# Patient Record
Sex: Female | Born: 1945 | Race: White | Hispanic: No | Smoking: Never smoker
Health system: Southern US, Community
[De-identification: ages and names within clinical notes are randomized; demographics above are authoritative.]

## PROBLEM LIST (undated history)

## (undated) DIAGNOSIS — I10 Essential (primary) hypertension: Secondary | ICD-10-CM

## (undated) DIAGNOSIS — E7800 Pure hypercholesterolemia, unspecified: Secondary | ICD-10-CM

## (undated) DIAGNOSIS — J302 Other seasonal allergic rhinitis: Secondary | ICD-10-CM

## (undated) HISTORY — PX: LAPAROSCOPIC VAGINAL HYSTERECTOMY: SUR798

## (undated) HISTORY — PX: OTHER SURGICAL HISTORY: SHX169

## (undated) HISTORY — DX: Essential (primary) hypertension: I10

## (undated) HISTORY — PX: BREAST ENHANCEMENT SURGERY: SHX7

## (undated) HISTORY — PX: BREAST LUMPECTOMY: SHX2

## (undated) HISTORY — PX: BREAST BIOPSY: SHX20

## (undated) HISTORY — PX: HEMORRHOID SURGERY: SHX153

## (undated) HISTORY — PX: VARICOSE VEIN SURGERY: SHX832

## (undated) HISTORY — DX: Pure hypercholesterolemia, unspecified: E78.00

## (undated) HISTORY — PX: HERNIA REPAIR: SHX51

## (undated) HISTORY — DX: Other seasonal allergic rhinitis: J30.2

## (undated) HISTORY — PX: HYSTERECTOMY: SHX81

---

## 1989-03-11 ENCOUNTER — Emergency Department: Admit: 1989-03-11 | Payer: Self-pay | Source: Ambulatory Visit

## 1989-09-04 ENCOUNTER — Ambulatory Visit: Admit: 1989-09-04 | Disposition: A | Discharge status: Home / Self Care | Payer: Self-pay

## 1991-11-22 ENCOUNTER — Ambulatory Visit: Admit: 1991-11-22 | Disposition: A | Discharge status: Home / Self Care | Payer: Self-pay | Source: Ambulatory Visit

## 1992-02-22 ENCOUNTER — Ambulatory Visit: Admit: 1992-02-22 | Disposition: A | Discharge status: Home / Self Care | Payer: Self-pay | Source: Ambulatory Visit

## 1993-02-26 ENCOUNTER — Emergency Department: Admit: 1993-02-26 | Disposition: A | Discharge status: Home / Self Care | Payer: Self-pay | Source: Ambulatory Visit

## 1993-11-06 ENCOUNTER — Ambulatory Visit: Admit: 1993-11-06 | Disposition: A | Discharge status: Home / Self Care | Payer: Self-pay | Source: Ambulatory Visit

## 1994-02-12 ENCOUNTER — Inpatient Hospital Stay
Admission: EM | Admit: 1994-02-12 | Disposition: A | Discharge status: Home / Self Care | Payer: Self-pay | Source: Ambulatory Visit

## 1994-10-18 ENCOUNTER — Ambulatory Visit: Admit: 1994-10-18 | Disposition: A | Discharge status: Home / Self Care | Payer: Self-pay | Source: Ambulatory Visit

## 1995-10-17 ENCOUNTER — Ambulatory Visit: Admit: 1995-10-17 | Disposition: A | Discharge status: Home / Self Care | Payer: Self-pay | Source: Ambulatory Visit

## 1996-10-18 ENCOUNTER — Ambulatory Visit: Admit: 1996-10-18 | Disposition: A | Discharge status: Home / Self Care | Payer: Self-pay | Source: Ambulatory Visit

## 1997-10-17 ENCOUNTER — Ambulatory Visit: Admit: 1997-10-17 | Disposition: A | Discharge status: Home / Self Care | Payer: Self-pay | Source: Ambulatory Visit

## 1998-03-10 ENCOUNTER — Emergency Department: Admit: 1998-03-10 | Disposition: A | Discharge status: Home / Self Care | Payer: Self-pay | Source: Ambulatory Visit

## 1998-08-29 ENCOUNTER — Ambulatory Visit: Admit: 1998-08-29 | Disposition: A | Discharge status: Home / Self Care | Payer: Self-pay | Source: Ambulatory Visit

## 1999-10-15 ENCOUNTER — Ambulatory Visit: Admit: 1999-10-15 | Disposition: A | Discharge status: Home / Self Care | Payer: Self-pay | Source: Ambulatory Visit

## 2000-12-29 ENCOUNTER — Ambulatory Visit: Admit: 2000-12-29 | Disposition: A | Discharge status: Home / Self Care | Payer: Self-pay | Source: Ambulatory Visit

## 2001-12-30 ENCOUNTER — Ambulatory Visit
Admission: RE | Admit: 2001-12-30 | Disposition: A | Discharge status: Home / Self Care | Payer: Self-pay | Source: Ambulatory Visit

## 2003-03-04 ENCOUNTER — Ambulatory Visit
Admission: RE | Admit: 2003-03-04 | Disposition: A | Discharge status: Home / Self Care | Payer: Self-pay | Source: Ambulatory Visit

## 2004-04-02 ENCOUNTER — Ambulatory Visit
Admission: RE | Admit: 2004-04-02 | Disposition: A | Discharge status: Home / Self Care | Payer: Self-pay | Source: Ambulatory Visit

## 2006-07-29 ENCOUNTER — Ambulatory Visit
Admission: RE | Admit: 2006-07-29 | Disposition: A | Discharge status: Home / Self Care | Payer: Self-pay | Source: Ambulatory Visit

## 2006-08-05 ENCOUNTER — Ambulatory Visit
Admission: RE | Admit: 2006-08-05 | Disposition: A | Discharge status: Home / Self Care | Payer: Self-pay | Source: Ambulatory Visit

## 2006-08-14 ENCOUNTER — Ambulatory Visit
Admission: RE | Admit: 2006-08-14 | Disposition: A | Discharge status: Home / Self Care | Payer: Self-pay | Source: Ambulatory Visit

## 2007-01-02 ENCOUNTER — Ambulatory Visit
Admission: RE | Admit: 2007-01-02 | Disposition: A | Discharge status: Home / Self Care | Payer: Self-pay | Source: Ambulatory Visit

## 2007-08-05 ENCOUNTER — Ambulatory Visit
Admission: RE | Admit: 2007-08-05 | Disposition: A | Discharge status: Home / Self Care | Payer: Self-pay | Source: Ambulatory Visit

## 2007-08-12 ENCOUNTER — Ambulatory Visit
Admission: RE | Admit: 2007-08-12 | Disposition: A | Discharge status: Home / Self Care | Payer: Self-pay | Source: Ambulatory Visit

## 2008-10-10 ENCOUNTER — Ambulatory Visit
Admission: RE | Admit: 2008-10-10 | Disposition: A | Discharge status: Home / Self Care | Payer: Self-pay | Source: Ambulatory Visit

## 2009-11-14 ENCOUNTER — Ambulatory Visit
Admission: RE | Admit: 2009-11-14 | Disposition: A | Discharge status: Home / Self Care | Payer: Self-pay | Source: Ambulatory Visit

## 2011-01-28 ENCOUNTER — Ambulatory Visit
Admission: RE | Admit: 2011-01-28 | Disposition: A | Discharge status: Home / Self Care | Payer: Self-pay | Source: Ambulatory Visit

## 2012-03-03 ENCOUNTER — Ambulatory Visit
Admission: RE | Admit: 2012-03-03 | Disposition: A | Discharge status: Home / Self Care | Payer: Self-pay | Source: Ambulatory Visit

## 2013-03-12 ENCOUNTER — Other Ambulatory Visit: Payer: Self-pay | Admitting: Gynecology

## 2013-03-12 DIAGNOSIS — Z1231 Encounter for screening mammogram for malignant neoplasm of breast: Secondary | ICD-10-CM

## 2013-03-22 ENCOUNTER — Ambulatory Visit
Admission: RE | Admit: 2013-03-22 | Discharge: 2013-03-22 | Disposition: A | Discharge status: Home / Self Care | Payer: BC Managed Care – PPO | Source: Ambulatory Visit | Attending: Gynecology | Admitting: Gynecology

## 2013-03-22 DIAGNOSIS — Z9889 Other specified postprocedural states: Secondary | ICD-10-CM | POA: Insufficient documentation

## 2013-03-22 DIAGNOSIS — Z1231 Encounter for screening mammogram for malignant neoplasm of breast: Secondary | ICD-10-CM

## 2013-04-27 ENCOUNTER — Encounter (INDEPENDENT_AMBULATORY_CARE_PROVIDER_SITE_OTHER): Payer: Self-pay

## 2013-04-27 ENCOUNTER — Ambulatory Visit (INDEPENDENT_AMBULATORY_CARE_PROVIDER_SITE_OTHER): Payer: BC Managed Care – PPO | Admitting: Physician Assistant

## 2013-04-27 VITALS — BP 164/98 | HR 84 | Temp 98.2°F | Resp 17 | Ht 62.0 in | Wt 164.0 lb

## 2013-04-27 DIAGNOSIS — J302 Other seasonal allergic rhinitis: Secondary | ICD-10-CM

## 2013-04-27 DIAGNOSIS — J309 Allergic rhinitis, unspecified: Secondary | ICD-10-CM

## 2013-04-27 NOTE — Progress Notes (Signed)
Down time forms used for this patient

## 2013-04-30 ENCOUNTER — Telehealth (INDEPENDENT_AMBULATORY_CARE_PROVIDER_SITE_OTHER): Payer: Self-pay | Admitting: Physician Assistant

## 2013-05-28 ENCOUNTER — Encounter (INDEPENDENT_AMBULATORY_CARE_PROVIDER_SITE_OTHER): Payer: Self-pay

## 2014-05-12 ENCOUNTER — Other Ambulatory Visit: Payer: Self-pay | Admitting: Gynecology

## 2014-05-12 DIAGNOSIS — Z1231 Encounter for screening mammogram for malignant neoplasm of breast: Secondary | ICD-10-CM

## 2014-06-30 ENCOUNTER — Ambulatory Visit
Admission: RE | Admit: 2014-06-30 | Discharge: 2014-06-30 | Disposition: A | Discharge status: Home / Self Care | Payer: BC Managed Care – PPO | Source: Ambulatory Visit | Attending: Gynecology | Admitting: Gynecology

## 2014-06-30 DIAGNOSIS — Z1231 Encounter for screening mammogram for malignant neoplasm of breast: Secondary | ICD-10-CM | POA: Insufficient documentation

## 2015-08-28 ENCOUNTER — Other Ambulatory Visit: Payer: Self-pay | Admitting: Family

## 2015-08-28 DIAGNOSIS — Z1231 Encounter for screening mammogram for malignant neoplasm of breast: Secondary | ICD-10-CM

## 2015-09-15 ENCOUNTER — Ambulatory Visit
Admission: RE | Admit: 2015-09-15 | Discharge: 2015-09-15 | Disposition: A | Discharge status: Home / Self Care | Payer: BC Managed Care – PPO | Source: Ambulatory Visit | Attending: Family | Admitting: Family

## 2015-09-15 DIAGNOSIS — Z1231 Encounter for screening mammogram for malignant neoplasm of breast: Secondary | ICD-10-CM | POA: Insufficient documentation

## 2015-09-15 DIAGNOSIS — N6489 Other specified disorders of breast: Secondary | ICD-10-CM | POA: Insufficient documentation

## 2015-09-15 DIAGNOSIS — R921 Mammographic calcification found on diagnostic imaging of breast: Secondary | ICD-10-CM | POA: Insufficient documentation

## 2015-10-30 ENCOUNTER — Ambulatory Visit (INDEPENDENT_AMBULATORY_CARE_PROVIDER_SITE_OTHER): Payer: BC Managed Care – PPO | Admitting: Family Medicine

## 2015-10-30 ENCOUNTER — Encounter (INDEPENDENT_AMBULATORY_CARE_PROVIDER_SITE_OTHER): Payer: Self-pay

## 2015-10-30 VITALS — BP 171/89 | HR 84 | Temp 98.7°F | Resp 20 | Ht 62.5 in | Wt 158.0 lb

## 2015-10-30 DIAGNOSIS — J209 Acute bronchitis, unspecified: Secondary | ICD-10-CM

## 2015-10-30 MED ORDER — FLUTICASONE PROPIONATE 50 MCG/ACT NA SUSP
2.0000 | Freq: Every day | NASAL | 0 refills | Status: AC
Start: 2015-10-30 — End: ?

## 2015-10-30 MED ORDER — FEXOFENADINE HCL 180 MG PO TABS
180.0000 mg | ORAL_TABLET | Freq: Every day | ORAL | 0 refills | Status: AC
Start: 2015-10-30 — End: ?

## 2015-10-30 MED ORDER — BENZONATATE 100 MG PO CAPS
100.0000 mg | ORAL_CAPSULE | Freq: Three times a day (TID) | ORAL | 0 refills | Status: AC | PRN
Start: 2015-10-30 — End: ?

## 2015-10-30 MED ORDER — IPRATROPIUM BROMIDE 0.02 % IN SOLN
0.5000 mg | Freq: Once | RESPIRATORY_TRACT | Status: AC
Start: 2015-10-30 — End: 2015-10-30
  Administered 2015-10-30: 0.5 mg via RESPIRATORY_TRACT

## 2015-10-30 MED ORDER — ALBUTEROL SULFATE (2.5 MG/3ML) 0.083% IN NEBU
2.5000 mg | INHALATION_SOLUTION | Freq: Once | RESPIRATORY_TRACT | Status: AC
Start: 2015-10-30 — End: 2015-10-30
  Administered 2015-10-30: 2.5 mg via RESPIRATORY_TRACT

## 2015-10-30 MED ORDER — ALBUTEROL SULFATE HFA 108 (90 BASE) MCG/ACT IN AERS
2.0000 | INHALATION_SPRAY | RESPIRATORY_TRACT | 0 refills | Status: AC | PRN
Start: 2015-10-30 — End: 2016-10-29

## 2015-10-30 NOTE — Progress Notes (Signed)
Subjective:    Patient ID: Kara Gardner is a 70 y.o. female.    HPI  Has been having cough and congestion for last 1 wk. Feels like deep cough and not producing sputum. No chest pain or shortness of breath or wheezing. Denies having fever. Has been taking otc medication without much help.     The following portions of the patient's history were reviewed and updated as appropriate: allergies, current medications, past family history, past medical history, past social history, past surgical history and problem list.    Review of Systems   Constitutional: Negative for fever.   HENT: Positive for congestion. Negative for ear pain and rhinorrhea.    Respiratory: Positive for cough. Negative for shortness of breath and wheezing.    Cardiovascular: Negative for chest pain.   Musculoskeletal: Negative for arthralgias.   Skin: Negative for rash.   Neurological: Negative for dizziness, light-headedness and headaches.   Hematological: Does not bruise/bleed easily.         Objective:    BP 171/89   Pulse 84   Temp 98.7 F (37.1 C) (Oral)   Resp 20   Ht 1.588 m (5' 2.5")   Wt 71.7 kg (158 lb)   BMI 28.44 kg/m     Physical Exam   Constitutional: She is oriented to person, place, and time. She appears well-developed.   HENT:   Head: Normocephalic.   Right Ear: External ear normal.   Left Ear: External ear normal.   Mouth/Throat: Oropharynx is clear and moist.   Eyes: Conjunctivae are normal.   Cardiovascular: Normal rate, regular rhythm and normal heart sounds.    Pulmonary/Chest: Effort normal and breath sounds normal. No respiratory distress.   Cough on deep breath   Neurological: She is alert and oriented to person, place, and time.   Skin: Skin is warm and dry.   Psychiatric: She has a normal mood and affect.         Assessment and Plan:       Kara Gardner was seen today for cough.    Diagnoses and all orders for this visit:    Acute bronchitis, unspecified organism    Other orders  -     albuterol (PROVENTIL)  nebulizer solution 2.5 mg; Take 3 mLs (2.5 mg total) by nebulization one time.      -     ipratropium (ATROVENT) 0.02 % nebulizer solution 0.5 mg; Take 2.5 mLs (0.5 mg total) by nebulization one time.      -     albuterol (PROVENTIL HFA;VENTOLIN HFA) 108 (90 Base) MCG/ACT inhaler; Inhale 2 puffs into the lungs every 4 (four) hours as needed for Wheezing.  -     fexofenadine (ALLEGRA ALLERGY) 180 MG tablet; Take 1 tablet (180 mg total) by mouth daily.  -     fluticasone (FLONASE) 50 MCG/ACT nasal spray; 2 sprays by Nasal route daily.  -     benzonatate (TESSALON PERLES) 100 MG capsule; Take 1 capsule (100 mg total) by mouth 3 (three) times daily as needed for Cough.      She felt much better after neb treatment.   Likely acute bronchitis. Try allegra, flonase, prn tessalon pearl and prn albuterol, plenty of fluid and rest.   Need to call if no better or worst. The Patient understands and agree with the plan.            Kara Gardner , MD  [facility]  10/30/2015  12:07  PM

## 2015-10-30 NOTE — Progress Notes (Signed)
Duo neb tx. Started   Neb tx. Finished pt. States she is breathing better.

## 2015-10-30 NOTE — Patient Instructions (Signed)
Acute Bronchitis  Your healthcare provider has told you that you have acute bronchitis. Bronchitis is infection or inflammation of the bronchial tubes (airways in the lungs). Normally, air moves easily in and out of the airways. Bronchitis narrows the airways, making it harder for air to flow in and out of the lungs. This causes symptoms such as shortness of breath, coughing, and wheezing. Bronchitis can be "acute" or "chronic." Acute means the condition comes on quickly and goes away in a short time. Chronic means a condition lasts a long time and often comes back. Read on to learn more about acute bronchitis.    What causes acute bronchitis?  Acute bronchitis almost always starts as a viral respiratory infection, such as a cold or the flu. Certain factors make it more likely for a cold or flu to turn into bronchitis. These include being very young or very old or having a heart or lung problem. Cigarette smoking also makes bronchitis more likely.  When bronchitis develops, the airways become swollen. The airways may also become infected with bacteria. This is known as a secondary infection.  Diagnosing acute bronchitis  Your healthcare provider will examine you and ask about your symptoms and health history. You may also have a sputum culture to test the fluid in your lungs. Chest X-rays may be done to look for infection in the lungs.  Treating acute bronchitis  Bronchitis usually clears up as the cold or flu goes away. You can help feel better faster by doing the following:   Take medicine as directed. You may be told to take ibuprofen or other over-the-counter medicines. These help relieve inflammation in your bronchial tubes. Your doctor may prescribe an inhaler to help open up the bronchial tubes. Most of the time,acute bronchitisis caused by a viral infection. Antibiotics are usually not prescribed for viral infections.   Drink plenty of fluids, such as water, juice, or warm soup. Fluids loosen mucus so  that you can cough it up. This helps you breathe more easily. Fluids also prevent dehydration.   Make sure you get plenty of rest.   Do not smoke. Do not allow anyone else to smoke in your home.  Recovery and follow-up  Follow up with your doctor as you are told. You will likely feel better in a week or two. But a dry cough can linger beyond that time. Let your doctor know if you still have symptoms (other than a dry cough) after 2 weeks. If you're prone to getting bronchial infections, let your doctor know. And take steps to protect yourself from future infections. These steps include stopping smoking and avoiding tobacco smoke, washing your hands often, and getting a yearly flu shot.  When to call the doctor  Call the doctor if you have any of the following:   Fever of 100.4F (38.0C) higher   Symptoms that get worse, or new symptoms   Trouble breathing   Symptoms that don't start to improve within a week, or within 3 days of taking antibiotics   Date Last Reviewed: 08/10/2012   2000-2016 [employer] [address], [place] [address]. All rights reserved. This information is not intended as a substitute for professional medical care. Always follow your healthcare professional's instructions.

## 2015-11-02 ENCOUNTER — Telehealth (INDEPENDENT_AMBULATORY_CARE_PROVIDER_SITE_OTHER): Payer: Self-pay

## 2015-11-02 NOTE — Telephone Encounter (Signed)
Talked with Patient She stated they were feeling much better. Patient had no further questions or concerns at this time

## 2016-01-08 HISTORY — PX: AUGMENTATION MAMMAPLASTY: SUR837

## 2016-05-22 ENCOUNTER — Encounter: Payer: Self-pay | Admitting: Family Medicine

## 2016-05-22 DIAGNOSIS — Z1231 Encounter for screening mammogram for malignant neoplasm of breast: Secondary | ICD-10-CM

## 2016-05-22 DIAGNOSIS — Z780 Asymptomatic menopausal state: Secondary | ICD-10-CM

## 2016-10-30 ENCOUNTER — Ambulatory Visit
Admission: RE | Admit: 2016-10-30 | Discharge: 2016-10-30 | Disposition: A | Discharge status: Home / Self Care | Payer: BC Managed Care – PPO | Source: Ambulatory Visit | Attending: Family Medicine | Admitting: Family Medicine

## 2016-10-30 DIAGNOSIS — Z1231 Encounter for screening mammogram for malignant neoplasm of breast: Secondary | ICD-10-CM | POA: Insufficient documentation

## 2016-10-30 DIAGNOSIS — Z780 Asymptomatic menopausal state: Secondary | ICD-10-CM | POA: Insufficient documentation

## 2016-10-30 DIAGNOSIS — Z13820 Encounter for screening for osteoporosis: Secondary | ICD-10-CM | POA: Insufficient documentation

## 2016-10-30 DIAGNOSIS — M810 Age-related osteoporosis without current pathological fracture: Secondary | ICD-10-CM | POA: Insufficient documentation

## 2017-01-30 ENCOUNTER — Ambulatory Visit (INDEPENDENT_AMBULATORY_CARE_PROVIDER_SITE_OTHER): Payer: Medicare HMO | Admitting: Obstetrics & Gynecology

## 2017-01-30 ENCOUNTER — Encounter: Payer: Self-pay | Admitting: Obstetrics & Gynecology

## 2017-01-30 NOTE — Progress Notes (Signed)
She was here to discuss an abnormal ultrasound.  However, the records were not available she worked for an hour today trying to get them but was unable to do so. She will come back when records are available.

## 2017-02-06 ENCOUNTER — Encounter: Payer: Medicare HMO | Admitting: Obstetrics & Gynecology

## 2017-02-10 ENCOUNTER — Encounter: Payer: Self-pay | Admitting: Obstetrics & Gynecology

## 2017-02-10 ENCOUNTER — Ambulatory Visit: Payer: Medicare HMO | Admitting: Obstetrics & Gynecology

## 2017-02-10 VITALS — BP 172/89 | Ht 65.0 in | Wt 166.0 lb

## 2017-02-10 DIAGNOSIS — N83201 Unspecified ovarian cyst, right side: Secondary | ICD-10-CM | POA: Diagnosis not present

## 2017-02-10 NOTE — Progress Notes (Signed)
Patient ID: Cheryl Riley, female   DOB: 07/30/1945, 72 y.o.   MRN: [id number]  Chief Complaint  Patient presents with  . Pelvic Mass  . New Patient (Initial Visit)    HPI Cheryl ArtLinda Sarno is a 72 y.o. female.  She was referred here by Dr. Deirdre PeerEnnis for evaluation of an u/s that showed a 1 x 1.2 x 1.1 cm right ovary with small cystic areas (largest measuring 0.6 cm). This record can be found under the media tab.  This ultrasound was originally done 11/20/189 for evaluation of bloating and pelvic discomfort. Those issues have been present for more than a year. No provocating factors, Seeing her chiropractor helps this pain. She denies constipation. HPI  Past Medical History:  Diagnosis Date  . Hypercholesteremia   . Hypertension     Past Surgical History:  Procedure Laterality Date  . BREAST BIOPSY    . BREAST ENHANCEMENT SURGERY    . BREAST LUMPECTOMY    . HEMORRHOID SURGERY    . HERNIA REPAIR    . LAPAROSCOPIC VAGINAL HYSTERECTOMY    . toe nail removal    . VARICOSE VEIN SURGERY      Family History  Problem Relation Age of Onset  . Lung disease Father   . Diabetes Sister   . Cancer - Other Sister        bronchial  . Throat cancer Brother   . Diabetes Sister     Social History Social History   Tobacco Use  . Smoking status: Former Smoker    Types: Cigarettes  . Smokeless tobacco: Never Used  Substance Use Topics  . Alcohol use: No    Frequency: Never  . Drug use: No    Allergies  Allergen Reactions  . Amoxicillin Itching  . 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 Codeine Itching  . Latex Rash  . Meperidine Nausea Only  . Statins Other (See Comments)    Myalgias - has tried a couple, but can't recall names Pravastatin - even caused myalgias  . Tramadol Itching    Other reaction(s): Other (See Comments) Upset stomach    Current Outpatient Medications  Medication Sig Dispense Refill  . pravastatin (PRAVACHOL)  10 MG tablet Take by mouth.    . pantoprazole (PROTONIX) 20 MG tablet      No current facility-administered medications for this visit.     Review of Systems Review of Systems She regularly a chiropractor Blood pressure (!) 172/89, height 5\' 5"  (1.651 m), weight 166 lb (75.3 kg).  Physical Exam Physical Exam  Breathing, conversing, and ambulating normally Well nourished, well hydrated White female, no apparent distress   Data Reviewed See ultrasound under media  Assessment    Pelvic discomfort, relieved by chiro---Rec see chiro more often Tiny ovarian cyst on right--check CA-125, but reassurance given    Plan           Josslynn Mentzer C Kervin Bones 02/10/2017, 1:42 PM

## 2017-02-10 NOTE — Progress Notes (Signed)
Will repeat BP before pt leaves

## 2017-02-11 LAB — CA 125: CA 125: 16 U/mL (ref <35)

## 2017-08-11 ENCOUNTER — Other Ambulatory Visit: Payer: Self-pay | Admitting: Family Medicine

## 2017-08-11 DIAGNOSIS — Z780 Asymptomatic menopausal state: Secondary | ICD-10-CM

## 2017-08-11 DIAGNOSIS — Z1231 Encounter for screening mammogram for malignant neoplasm of breast: Secondary | ICD-10-CM

## 2017-08-27 ENCOUNTER — Other Ambulatory Visit: Payer: Medicare HMO

## 2017-08-27 ENCOUNTER — Ambulatory Visit: Payer: Medicare HMO

## 2017-08-29 ENCOUNTER — Ambulatory Visit (INDEPENDENT_AMBULATORY_CARE_PROVIDER_SITE_OTHER): Payer: Medicare HMO

## 2017-08-29 ENCOUNTER — Other Ambulatory Visit: Payer: Self-pay | Admitting: Family Medicine

## 2017-08-29 DIAGNOSIS — Z780 Asymptomatic menopausal state: Secondary | ICD-10-CM

## 2017-08-29 DIAGNOSIS — Z1231 Encounter for screening mammogram for malignant neoplasm of breast: Secondary | ICD-10-CM | POA: Diagnosis not present

## 2017-10-21 DIAGNOSIS — Z1231 Encounter for screening mammogram for malignant neoplasm of breast: Secondary | ICD-10-CM

## 2017-11-12 ENCOUNTER — Ambulatory Visit: Payer: BC Managed Care – PPO

## 2017-11-19 ENCOUNTER — Ambulatory Visit
Admission: RE | Admit: 2017-11-19 | Discharge: 2017-11-19 | Disposition: A | Discharge status: Home / Self Care | Payer: BC Managed Care – PPO | Attending: Family Medicine | Admitting: Family Medicine

## 2017-11-19 DIAGNOSIS — Z1231 Encounter for screening mammogram for malignant neoplasm of breast: Secondary | ICD-10-CM | POA: Insufficient documentation

## 2018-02-23 ENCOUNTER — Other Ambulatory Visit (RURAL_HEALTH_CENTER): Payer: Self-pay

## 2018-02-23 ENCOUNTER — Encounter (RURAL_HEALTH_CENTER): Payer: Self-pay

## 2018-02-23 DIAGNOSIS — Z1211 Encounter for screening for malignant neoplasm of colon: Secondary | ICD-10-CM

## 2018-02-26 DIAGNOSIS — Z1211 Encounter for screening for malignant neoplasm of colon: Secondary | ICD-10-CM | POA: Insufficient documentation

## 2018-04-29 ENCOUNTER — Ambulatory Visit: Admit: 2018-04-29 | Payer: Self-pay | Admitting: Specialist

## 2018-04-29 SURGERY — DONT USE, USE 1094-COLONOSCOPY, DIAGNOSTIC (SCREENING)
Anesthesia: Monitor Anesthesia Care | Site: Anus

## 2018-06-05 ENCOUNTER — Telehealth (RURAL_HEALTH_CENTER): Payer: Self-pay

## 2018-06-05 NOTE — Telephone Encounter (Signed)
PTS WILL CALL BACK TO [facility] AS SHE IS GOING BACK TO WORK - DR.PULIZZI

## 2019-06-10 DIAGNOSIS — Z1231 Encounter for screening mammogram for malignant neoplasm of breast: Secondary | ICD-10-CM

## 2019-09-02 ENCOUNTER — Ambulatory Visit
Admission: RE | Admit: 2019-09-02 | Discharge: 2019-09-02 | Disposition: A | Discharge status: Home / Self Care | Payer: Medicare Other | Attending: Family Medicine | Admitting: Family Medicine

## 2019-09-02 DIAGNOSIS — Z1231 Encounter for screening mammogram for malignant neoplasm of breast: Secondary | ICD-10-CM | POA: Insufficient documentation

## 2020-04-04 IMAGING — MG DIGITAL SCREENING BILATERAL MAMMOGRAM WITH IMPLANTS, CAD AND TOM
9 of 12 series · 9 of 28 positions shown · non-contrast
Comparison: Previous exam(s).

CLINICAL DATA: Screening.

EXAM:
DIGITAL SCREENING BILATERAL MAMMOGRAM WITH IMPLANTS, CAD AND TOMO
The patient has retropectoral implants. Standard and implant
displaced views were performed.

[L MLO]
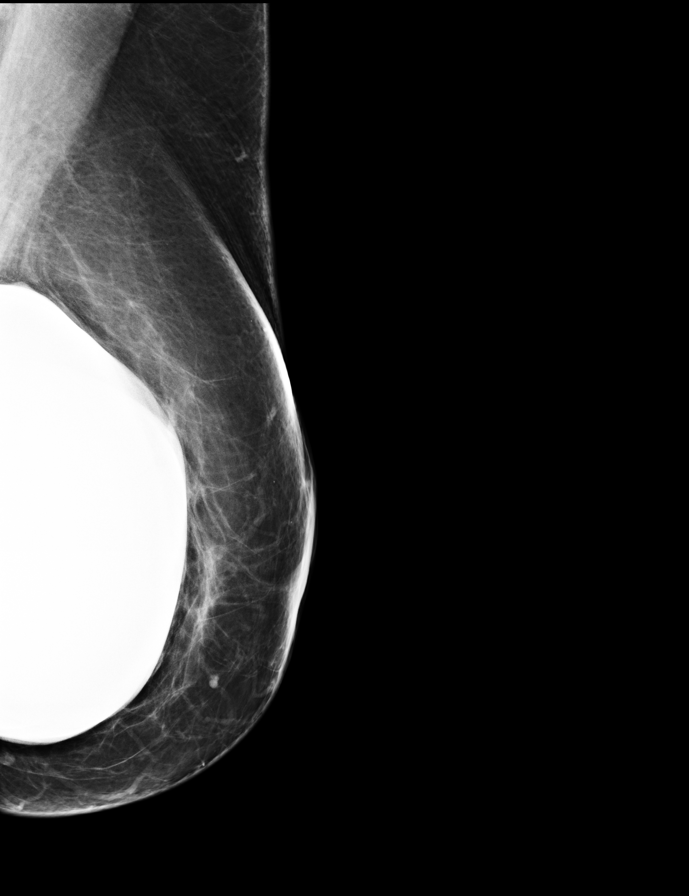

[R CC]
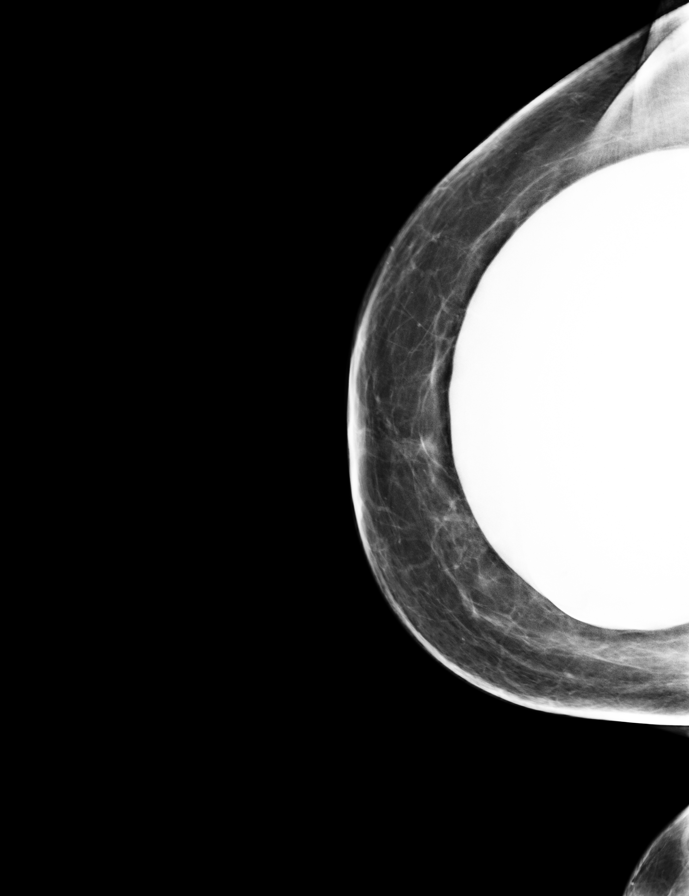

[R MLO]
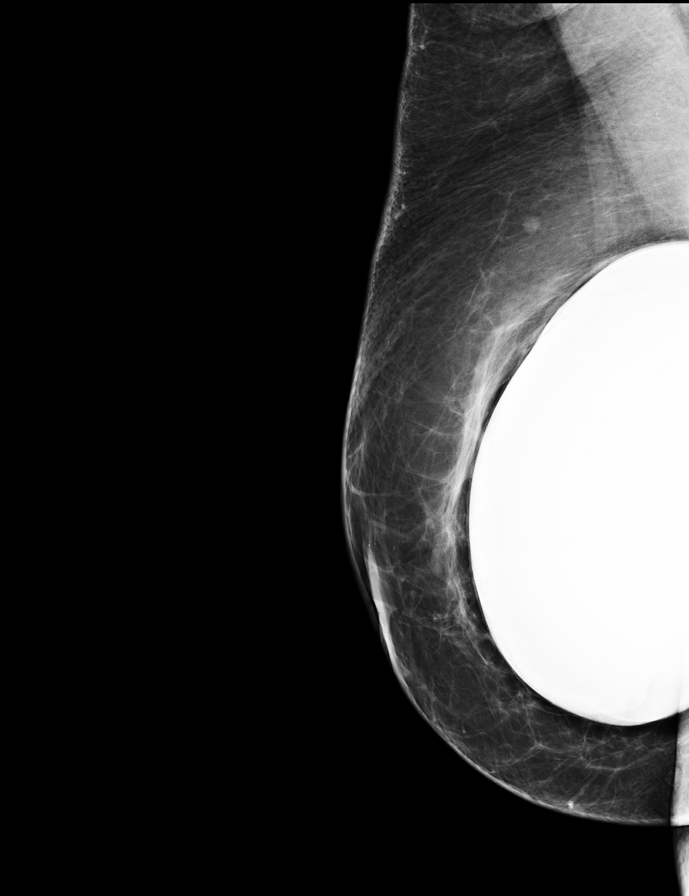

[L CC]
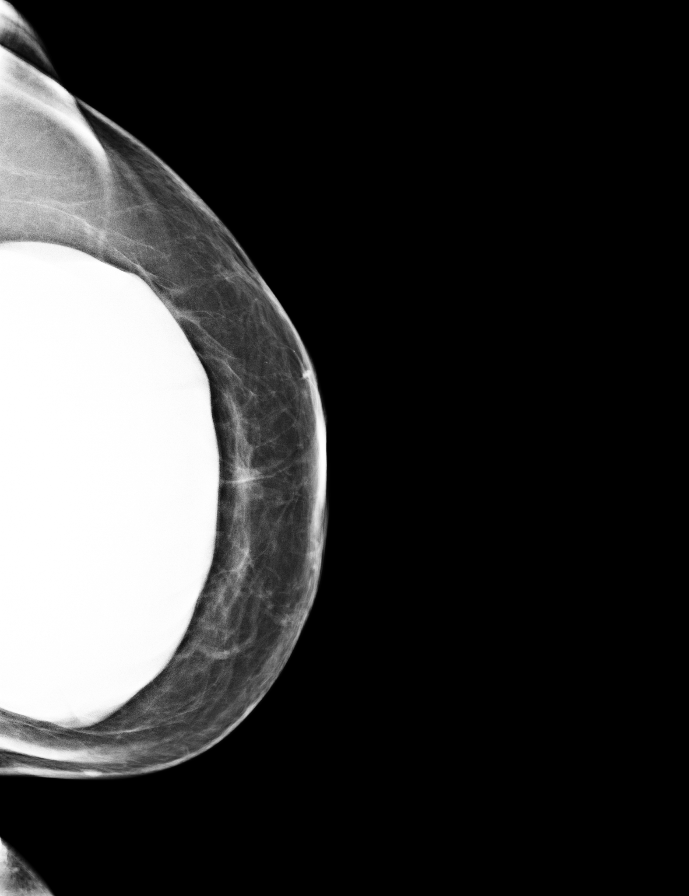

[L MLO synth-2D]
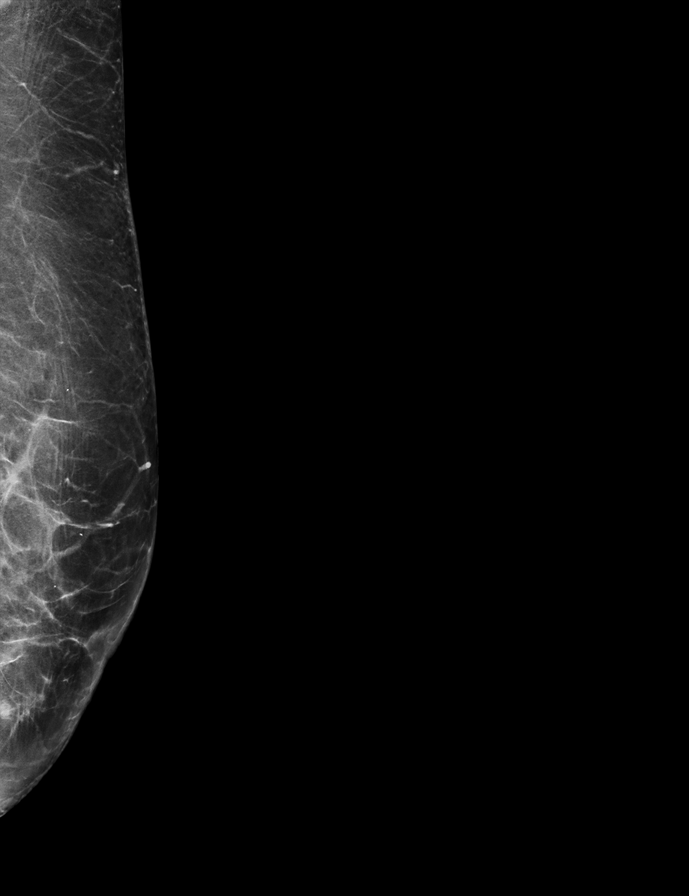

[R MLO synth-2D]
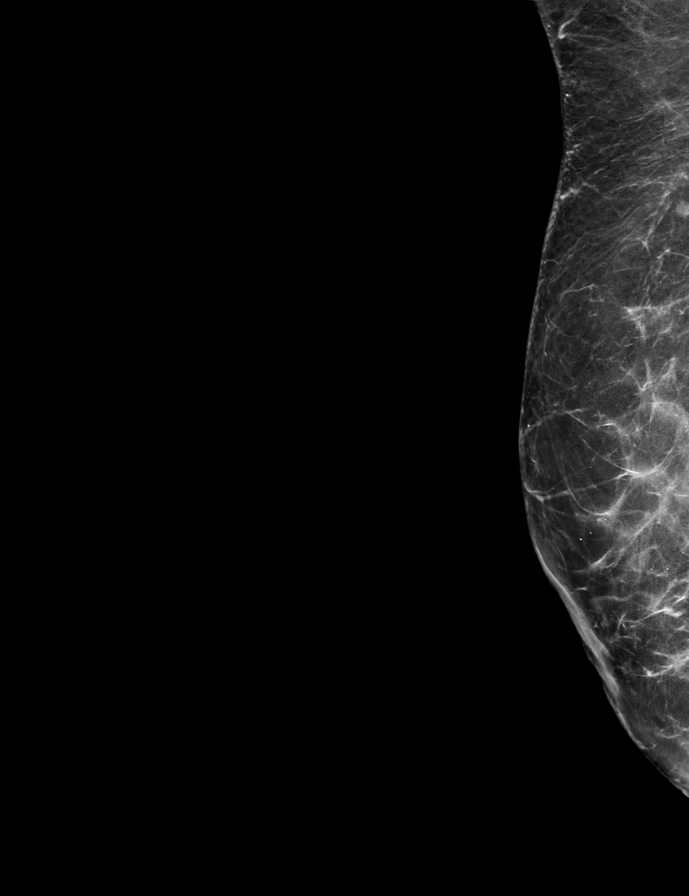

[R CC synth-2D]
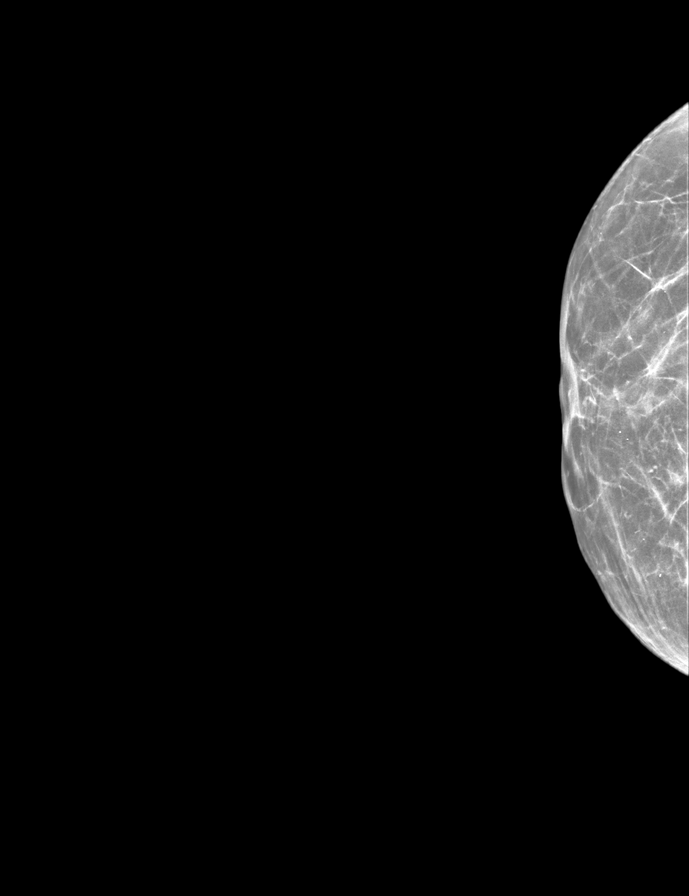

[L CC synth-2D]
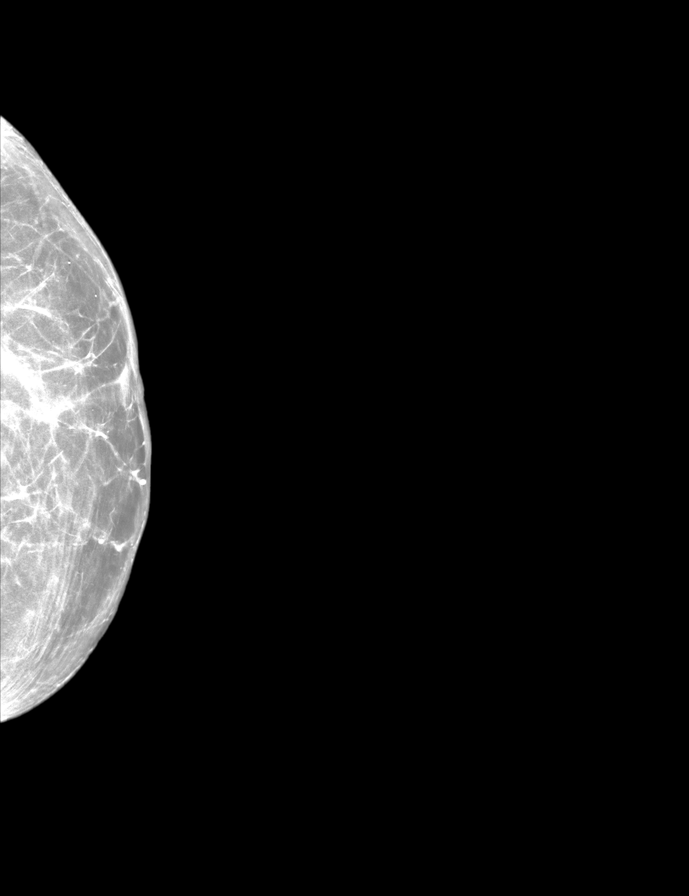

[R MLO tomo · tomo slice 30/59.0]
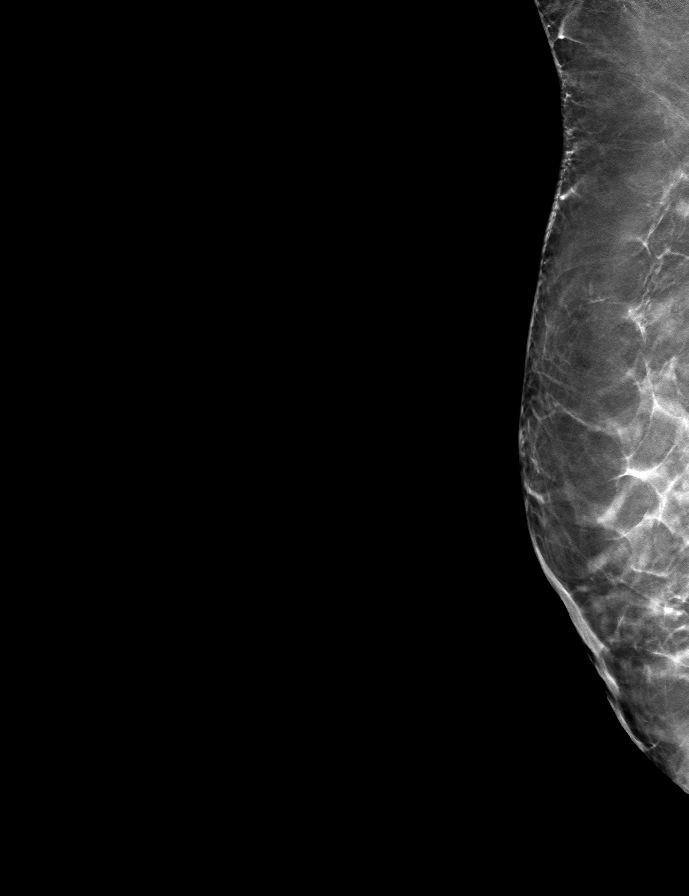

[9 of 28 positions shown; findings below may reference images not displayed]

ACR Breast Density Category b: There are scattered areas of
fibroglandular density.
FINDINGS: There are no findings suspicious for malignancy. Images were
processed with CAD.
IMPRESSION: No mammographic evidence of malignancy. A result letter of this
screening mammogram will be mailed directly to the patient.

RECOMMENDATION:
Screening mammogram in one year. (Code:60-T-8Z4)

BI-RADS CATEGORY  1:  Negative.

## 2021-02-24 ENCOUNTER — Encounter: Payer: Self-pay | Admitting: Emergency Medicine

## 2021-02-24 ENCOUNTER — Other Ambulatory Visit: Payer: Self-pay

## 2021-02-24 ENCOUNTER — Emergency Department
Admission: EM | Admit: 2021-02-24 | Discharge: 2021-02-24 | Disposition: A | Discharge status: Home / Self Care | Payer: Medicare HMO | Source: Home / Self Care | Attending: Family Medicine | Admitting: Family Medicine

## 2021-02-24 DIAGNOSIS — J209 Acute bronchitis, unspecified: Secondary | ICD-10-CM

## 2021-02-24 MED ORDER — BENZONATATE 200 MG PO CAPS: 200.0000 mg | ORAL_CAPSULE | Freq: Three times a day (TID) | ORAL | 0 refills | Status: AC | PRN

## 2021-02-24 MED ORDER — AZITHROMYCIN 250 MG PO TABS: ORAL_TABLET | ORAL | 0 refills | Status: AC

## 2021-02-24 NOTE — Discharge Instructions (Signed)
Take the antibiotic as prescribed. Take Tessalon 2-3 times a day Drink more water Run humidifier in your room if you have 1

## 2021-02-24 NOTE — ED Triage Notes (Signed)
Cough x 4 days w/ sinus drainage  Tessalon perles work well per pt  Denies fever  Chills on Wed  Tylenol OTC  COVID in 2022 No COVID vaccine

## 2021-02-24 NOTE — ED Provider Notes (Signed)
[facility]    CSN: [id number] Arrival date & time: 02/24/21  1206      History   Chief Complaint Chief Complaint  Patient presents with   Cough    HPI Cheryl Riley is a 76 y.o. female.   HPI Patient states that she has allergies.  She has multiple respiratory infections.  Has recurring sinus infections.  Currently states that she has 4 to 5 days of cough and cold symptoms.  Sinus pressure and pain.  Postnasal drip.  Harsh cough.  Pain with coughing.  Coughing up thick mucus. Past Medical History:  Diagnosis Date   Hypercholesteremia    Hypertension     There are no problems to display for this patient.   Past Surgical History:  Procedure Laterality Date   AUGMENTATION MAMMAPLASTY Bilateral 2018   Replaced last year due to other ruptured, in front of muscle   BREAST BIOPSY     BREAST ENHANCEMENT SURGERY     BREAST LUMPECTOMY     HEMORRHOID SURGERY     HERNIA REPAIR     LAPAROSCOPIC VAGINAL HYSTERECTOMY     toe nail removal     VARICOSE VEIN SURGERY      OB History     Gravida  7   Para  4   Term  4   Preterm      AB  3   Living  1      SAB  3   IAB      Ectopic      Multiple      Live Births               Home Medications    Prior to Admission medications   Medication Sig Start Date End Date Taking? Authorizing Provider  azithromycin (ZITHROMAX Z-PAK) 250 MG tablet Take two pills today followed by one a day until gone 02/24/21  Yes Eustace Moore, MD  benzonatate (TESSALON) 200 MG capsule Take 1 capsule (200 mg total) by mouth 3 (three) times daily as needed for cough. 02/24/21  Yes Eustace Moore, MD  hydrochlorothiazide (MICROZIDE) 12.5 MG capsule Take 12.5 mg by mouth daily. 01/10/21   [provider]  pantoprazole (PROTONIX) 20 MG tablet  11/21/16   [provider]  pravastatin (PRAVACHOL) 10 MG tablet Take by mouth. Patient not taking: Reported on 02/24/2021 08/08/16 08/08/17  [provider]    Family History Family History  Problem Relation Age of Onset   Lung disease Father    Diabetes Sister    Cancer - Other Sister        bronchial   Diabetes Sister    Throat cancer Brother     Social History Social History   Tobacco Use   Smoking status: Former    Types: Cigarettes    Quit date: 2002    Years since quitting: 21.1   Smokeless tobacco: Never  Vaping Use   Vaping Use: Never used  Substance Use Topics   Alcohol use: No   Drug use: No     Allergies   Amoxicillin, Aspirin, Codeine, Latex, Meperidine, Statins, and Tramadol   Review of Systems Review of Systems  See HPI Physical Exam Triage Vital Signs ED Triage Vitals  Enc Vitals Group     BP 02/24/21 1233 (!) 148/90     Pulse Rate 02/24/21 1233 89     Resp 02/24/21 1233 15     Temp 02/24/21 1233 98.9 F (37.2  C)     Temp Source 02/24/21 1233 Oral     SpO2 02/24/21 1233 98 %     Weight 02/24/21 1236 165 lb (74.8 kg)     Height 02/24/21 1236 5\' 5"  (1.651 m)     Head Circumference --      Peak Flow --      Pain Score 02/24/21 1236 0     Pain Loc --      Pain Edu? --      Excl. in GC? --    No data found.  Updated Vital Signs BP (!) 148/90 (BP Location: Left Arm)    Pulse 89    Temp 98.9 F (37.2 C) (Oral)    Resp 15    Ht 5\' 5"  (1.651 m)    Wt 74.8 kg    SpO2 98%    BMI 27.46 kg/m   Visual Acuity Right Eye Distance:   Left Eye Distance:   Bilateral Distance:    Right Eye Near:   Left Eye Near:    Bilateral Near:     Physical Exam Constitutional:      General: She is not in acute distress.    Appearance: Normal appearance. She is well-developed.  HENT:     Head: Normocephalic and atraumatic.     Right Ear: Tympanic membrane normal.     Left Ear: Tympanic membrane and ear canal normal.     Nose: Congestion and rhinorrhea present.     Comments: Nasal membranes are swollen and red.  Maxillary sinuses tender.  Posterior pharynx has erythema. Eyes:     Conjunctiva/sclera:  Conjunctivae normal.     Pupils: Pupils are equal, round, and reactive to light.  Cardiovascular:     Rate and Rhythm: Normal rate and regular rhythm.     Heart sounds: Normal heart sounds.  Pulmonary:     Effort: Pulmonary effort is normal. No respiratory distress.     Breath sounds: Normal breath sounds.  Abdominal:     General: There is no distension.     Palpations: Abdomen is soft.  Musculoskeletal:        General: Normal range of motion.     Cervical back: Normal range of motion.  Lymphadenopathy:     Cervical: No cervical adenopathy.  Skin:    General: Skin is warm and dry.  Neurological:     Mental Status: She is alert.     UC Treatments / Results  Labs (all labs ordered are listed, but only abnormal results are displayed) Labs Reviewed - No data to display  EKG   Radiology No results found.  Procedures Procedures (including critical care time)  Medications Ordered in UC Medications - No data to display  Initial Impression / Assessment and Plan / UC Course  I have reviewed the triage vital signs and the nursing notes.  Pertinent labs & imaging results that were available during my care of the patient were reviewed by me and considered in my medical decision making (see chart for details).     Final Clinical Impressions(s) / UC Diagnoses   Final diagnoses:  Acute bronchitis, unspecified organism     Discharge Instructions      Take the antibiotic as prescribed. Take Tessalon 2-3 times a day Drink more water Run humidifier in your room if you have 1   ED Prescriptions     Medication Sig Dispense Auth. Provider   benzonatate (TESSALON) 200 MG capsule Take 1 capsule (200 mg total) by  mouth 3 (three) times daily as needed for cough. 21 capsule Eustace Moore, MD   azithromycin (ZITHROMAX Z-PAK) 250 MG tablet Take two pills today followed by one a day until gone 6 tablet Delton See Letta Pate, MD      PDMP not reviewed this encounter.    Eustace Moore, MD 02/24/21 1316

## 2022-01-23 ENCOUNTER — Ambulatory Visit (RURAL_HEALTH_CENTER): Payer: Medicare Other | Admitting: Family Medicine
